# Patient Record
Sex: Male | Born: 1970 | Hispanic: Yes | Marital: Married | Smoking: Never smoker
Health system: Southern US, Community
[De-identification: ages and names within clinical notes are randomized; demographics above are authoritative.]

## PROBLEM LIST (undated history)

## (undated) HISTORY — PX: APPENDECTOMY: SHX54

---

## 2008-04-26 ENCOUNTER — Emergency Department: Payer: Self-pay | Admitting: Emergency Medicine

## 2016-03-31 ENCOUNTER — Ambulatory Visit
Admission: EM | Admit: 2016-03-31 | Discharge: 2016-03-31 | Disposition: A | Discharge status: Home / Self Care | Payer: Self-pay | Attending: Family Medicine | Admitting: Family Medicine

## 2016-03-31 ENCOUNTER — Encounter: Payer: Self-pay | Admitting: Emergency Medicine

## 2016-03-31 DIAGNOSIS — H9192 Unspecified hearing loss, left ear: Secondary | ICD-10-CM

## 2016-03-31 DIAGNOSIS — R42 Dizziness and giddiness: Secondary | ICD-10-CM

## 2016-03-31 LAB — GLUCOSE, CAPILLARY: Glucose-Capillary: 116 mg/dL — ABNORMAL HIGH (ref 65–99)

## 2016-03-31 MED ORDER — MECLIZINE HCL 25 MG PO TABS: ORAL_TABLET | ORAL | Status: AC

## 2016-03-31 NOTE — ED Notes (Signed)
Patient c/o weakness and dizziness that started yesterday.  Patient reports pressure in his left ear for the past 2 months.  Patient denies any pain.

## 2016-03-31 NOTE — ED Provider Notes (Signed)
CSN: [id number]     Arrival date & time 03/31/16  1217 History   First MD Initiated Contact with Patient 03/31/16 1240     Chief Complaint  Patient presents with  . Weakness   (Consider location/radiation/quality/duration/timing/severity/associated sxs/prior Treatment) HPI: Patient presents today with symptoms of dizziness which started yesterday. He states that he feels some pressure in his left ear and has had decreased hearing in his left ear for at least 1-2 months. He denies any ear pain or any URI symptoms. He states that his symptoms typically are with movement of his head. He denies any increased caffeine use and admits to being hydrated. He does work in an [employer] though. He denies any headache or vomiting but has felt nauseous. Denies taking any new medications.  History reviewed. No pertinent past medical history. Past Surgical History  Procedure Laterality Date  . Appendectomy     History reviewed. No pertinent family history. Social History  Substance Use Topics  . Smoking status: Never Smoker   . Smokeless tobacco: None  . Alcohol Use: Yes    Review of Systems: Negative except mentioned above.   Allergies  Review of patient's allergies indicates no known allergies.  Home Medications   Prior to Admission medications   Not on File   Meds Ordered and Administered this Visit  Medications - No data to display  BP 138/94 mmHg  Pulse 76  Temp(Src) 97 F (36.1 C) (Tympanic)  Resp 16  Ht 5\' 7"  (1.702 m)  Wt 164 lb (74.39 kg)  BMI 25.68 kg/m2  SpO2 100% No data found.   Physical Exam:  GENERAL: NAD HEENT: no pharyngeal erythema, no exudate, no erythema of TMs, no cerumen appreciated, no cervical LAD RESP: CTA B CARD: RRR NEURO: CN II-XII grossly intact, -Rombergs  ED Course  Procedures (including critical care time)  Labs Review Labs Reviewed - No data to display  Imaging Review No results found.    MDM  A/P: Left-sided decreased hearing,  dizziness - will treat for vertigo with meclizine, orthostatics were checked, fingerstick blood sugar normal, work excuse given for 2 days, if symptoms worsen go to the ER as discussed. I do recommend that he follow up with ENT given his hearing issues and current symptoms. Encourage patient not to drive until dizziness has resolved. Patient addresses understanding and appreciative    Jolene ProvostKirtida Shealynn Saulnier, MD 03/31/16 1321

## 2019-01-29 ENCOUNTER — Emergency Department: Payer: Self-pay

## 2019-01-29 ENCOUNTER — Encounter: Payer: Self-pay | Admitting: Emergency Medicine

## 2019-01-29 ENCOUNTER — Emergency Department
Admission: EM | Admit: 2019-01-29 | Discharge: 2019-01-29 | Disposition: A | Discharge status: Home / Self Care | Payer: Self-pay | Attending: Emergency Medicine | Admitting: Emergency Medicine

## 2019-01-29 ENCOUNTER — Other Ambulatory Visit: Payer: Self-pay

## 2019-01-29 DIAGNOSIS — G9589 Other specified diseases of spinal cord: Secondary | ICD-10-CM | POA: Insufficient documentation

## 2019-01-29 DIAGNOSIS — M5412 Radiculopathy, cervical region: Secondary | ICD-10-CM | POA: Insufficient documentation

## 2019-01-29 LAB — URINALYSIS, COMPLETE (UACMP) WITH MICROSCOPIC
Bacteria, UA: NONE SEEN
Bilirubin Urine: NEGATIVE
Glucose, UA: NEGATIVE mg/dL
Ketones, ur: NEGATIVE mg/dL
Leukocytes,Ua: NEGATIVE
Nitrite: NEGATIVE
Protein, ur: NEGATIVE mg/dL
Specific Gravity, Urine: 1.025 (ref 1.005–1.030)
Squamous Epithelial / LPF: NONE SEEN (ref 0–5)
pH: 5 (ref 5.0–8.0)

## 2019-01-29 LAB — CBC
HCT: 41.5 % (ref 39.0–52.0)
Hemoglobin: 13.1 g/dL (ref 13.0–17.0)
MCH: 25.7 pg — ABNORMAL LOW (ref 26.0–34.0)
MCHC: 31.6 g/dL (ref 30.0–36.0)
MCV: 81.4 fL (ref 80.0–100.0)
Platelets: 278 10*3/uL (ref 150–400)
RBC: 5.1 MIL/uL (ref 4.22–5.81)
RDW: 14.1 % (ref 11.5–15.5)
WBC: 7 10*3/uL (ref 4.0–10.5)
nRBC: 0 % (ref 0.0–0.2)

## 2019-01-29 LAB — BASIC METABOLIC PANEL
Anion gap: 7 (ref 5–15)
BUN: 20 mg/dL (ref 6–20)
CO2: 26 mmol/L (ref 22–32)
Calcium: 8.3 mg/dL — ABNORMAL LOW (ref 8.9–10.3)
Chloride: 105 mmol/L (ref 98–111)
Creatinine, Ser: 0.71 mg/dL (ref 0.61–1.24)
GFR calc Af Amer: >60 mL/min (ref >60)
GFR calc non Af Amer: >60 mL/min (ref >60)
Glucose, Bld: 106 mg/dL — ABNORMAL HIGH (ref 70–99)
Potassium: 3.8 mmol/L (ref 3.5–5.1)
Sodium: 138 mmol/L (ref 135–145)

## 2019-01-29 LAB — TROPONIN I: Troponin I: <0.03 ng/mL (ref <0.03)

## 2019-01-29 MED ORDER — DEXAMETHASONE SODIUM PHOSPHATE 10 MG/ML IJ SOLN
10.0000 mg | Freq: Once | INTRAMUSCULAR | Status: AC
  Administered 2019-01-29: 10 mg via INTRAVENOUS
  Filled 2019-01-29: qty 1

## 2019-01-29 MED ORDER — GADOBUTROL 1 MMOL/ML IV SOLN
7.0000 mL | Freq: Once | INTRAVENOUS | Status: AC | PRN
  Administered 2019-01-29: 7 mL via INTRAVENOUS

## 2019-01-29 MED ORDER — DEXAMETHASONE 4 MG PO TABS: 4.0000 mg | ORAL_TABLET | Freq: Two times a day (BID) | ORAL | 0 refills | Status: AC

## 2019-01-29 NOTE — ED Provider Notes (Signed)
[facility] Emergency Department Provider Note       Time seen: ----------------------------------------- 8:23 AM on 01/29/2019 -----------------------------------------   I have reviewed the triage vital signs and the nursing notes.  HISTORY   Chief Complaint Chest Pain    HPI Raymond Love is a 48 y.o. male with no significant past medical history who presents to the ED for left-sided chest pain that started this morning.  Patient states he has had weakness in both hands for the last week.  Patient also describes an abnormal sensation that goes down his arms and some weakness with raising his arms overhead.  History reviewed. No pertinent past medical history.  There are no active problems to display for this patient.   Past Surgical History:  Procedure Laterality Date  . APPENDECTOMY      Allergies Patient has no known allergies.  Social History Social History   Tobacco Use  . Smoking status: Never Smoker  . Smokeless tobacco: Never Used  Substance Use Topics  . Alcohol use: Yes  . Drug use: Never   Review of Systems Constitutional: Negative for fever. Cardiovascular: Positive for chest pain Respiratory: Negative for shortness of breath. Gastrointestinal: Negative for abdominal pain, vomiting and diarrhea. Musculoskeletal: Positive for back pain Skin: Negative for rash. Neurological: Positive for arm weakness  All systems negative/normal/unremarkable except as stated in the HPI  ____________________________________________   PHYSICAL EXAM:  VITAL SIGNS: ED Triage Vitals [01/29/19 0642]  Enc Vitals Group     BP      Pulse      Resp      Temp      Temp src      SpO2      Weight 164 lb (74.4 kg)     Height 5\' 3"  (1.6 m)     Head Circumference      Peak Flow      Pain Score 9     Pain Loc      Pain Edu?      Excl. in GC?    Constitutional: Alert and oriented. Well appearing and in no distress. Eyes:  Conjunctivae are normal. Normal extraocular movements. ENT      Head: Normocephalic and atraumatic.      Nose: No congestion/rhinnorhea.      Mouth/Throat: Mucous membranes are moist.      Neck: No stridor. Cardiovascular: Normal rate, regular rhythm. No murmurs, rubs, or gallops. Respiratory: Normal respiratory effort without tachypnea nor retractions. Breath sounds are clear and equal bilaterally. No wheezes/rales/rhonchi. Gastrointestinal: Soft and nontender. Normal bowel sounds Musculoskeletal: Nontender with normal range of motion in extremities. No lower extremity tenderness nor edema. Neurologic:  Normal speech and language. No gross focal neurologic deficits are appreciated.  Skin:  Skin is warm, dry and intact. No rash noted. Psychiatric: Mood and affect are normal. Speech and behavior are normal.  ____________________________________________  EKG: Interpreted by me.  Sinus rhythm with rate of 86 bpm, normal PR interval, normal QRS, normal QT  ____________________________________________  ED COURSE:  As part of my medical decision making, I reviewed the following data within the electronic MEDICAL RECORD NUMBER History obtained from family if available, nursing notes, old chart and ekg, as well as notes from prior ED visits. Patient presented for multiple complaints, we will assess with labs and imaging as indicated at this time.   Procedures  Raymond Love was evaluated in Emergency Department on 01/29/2019 for the symptoms described in the history of present  illness. He was evaluated in the context of the global COVID-19 pandemic, which necessitated consideration that the patient might be at risk for infection with the SARS-CoV-2 virus that causes COVID-19. Institutional protocols and algorithms that pertain to the evaluation of patients at risk for COVID-19 are in a state of rapid change based on information released by regulatory bodies including the CDC and federal and  state organizations. These policies and algorithms were followed during the patient's care in the ED.  ____________________________________________   LABS (pertinent positives/negatives)  Labs Reviewed  BASIC METABOLIC PANEL - Abnormal; Notable for the following components:      Result Value   Glucose, Bld 106 (*)    Calcium 8.3 (*)    All other components within normal limits  CBC - Abnormal; Notable for the following components:   MCH 25.7 (*)    All other components within normal limits  URINALYSIS, COMPLETE (UACMP) WITH MICROSCOPIC - Abnormal; Notable for the following components:   Color, Urine YELLOW (*)    APPearance CLEAR (*)    Hgb urine dipstick SMALL (*)    All other components within normal limits  TROPONIN I   CRITICAL CARE Performed by: Ulice DashJohnathan E Bijon Mineer   Total critical care time: 30 minutes  Critical care time was exclusive of separately billable procedures and treating other patients.  Critical care was necessary to treat or prevent imminent or life-threatening deterioration.  Critical care was time spent personally by me on the following activities: development of treatment plan with patient and/or surrogate as well as nursing, discussions with consultants, evaluation of patient's response to treatment, examination of patient, obtaining history from patient or surrogate, ordering and performing treatments and interventions, ordering and review of laboratory studies, ordering and review of radiographic studies, pulse oximetry and re-evaluation of patient's condition.  RADIOLOGY Images were viewed by me  Chest x-ray, thoracic spine x-ray, cervical spine series IMPRESSION: Large enhancing mass enlarging the right foramen at C3-4. A significant amount of mass is present in the spinal canal causing severe spinal stenosis and severe cord compression. There is edema in the cord. There is probable hemorrhage within the tumor. 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  Cervical spondylosis as above.  These results were called by telephone at the time of interpretation on 01/29/2019 at 12:20 pm to Dr. Daryel NovemberJONATHAN Ivy Puryear , who verbally acknowledged these results.  ____________________________________________   DIFFERENTIAL DIAGNOSIS   Degenerative disc disease, herniated disc, cervical radiculopathy, arthritis, musculoskeletal pain, unstable angina  FINAL ASSESSMENT AND PLAN  Cervical spine mass, radiculopathy   Plan: The patient had presented for multiple complaints. Patient's labs are reassuring. Patient's imaging unfortunately revealed a large enhancing mass at C3-C4 with severe spinal stenosis and cord compression.  Amazingly he has some arm weakness, worse on the right with paresthesias.  He also notes decrease pain and temperature sensation in his left arm.  I have discussed with neurosurgery for evaluation.  He has received IV Decadron in the ER.   Ulice DashJohnathan E Harli Engelken, MD    Note: This note was generated in part or whole with voice recognition software. Voice recognition is usually quite accurate but there are transcription errors that can and very often do occur. I apologize for any typographical errors that were not detected and corrected.     Emily FilbertWilliams, Myalynn Lingle E, MD 01/29/19 [phone]

## 2019-01-29 NOTE — ED Provider Notes (Signed)
Patient has been evaluated by neurosurgery, plan is for follow-up with Dr. Marcell Barlow tomorrow.  He will continue on Decadron 4 mg twice a day.  Patient is in agreement with this plan.   Emily Filbert, MD 01/29/19 [phone]

## 2019-01-29 NOTE — ED Triage Notes (Signed)
Patient with complaint of left side chest pain that started this morning. Patient also states that he has had weakness in bilateral hands times one week.

## 2019-01-29 NOTE — Consult Note (Signed)
Referring Physician:  No referring provider defined for this encounter.  Primary Physician:  [facility]  Chief Complaint: Upper extremity weakness  History of Present Illness: Raymond Love is a 48 y.o. male who presents as a consult from the emergency department for spinal cord mass resulting in severe spinal cord compression.  Patient states that he has been experiencing upper and lower extremity sensory changes for quite some time but over the last 2 weeks he has been experiencing progressive upper and lower extremity weakness.  Symptoms consistent with cervical myelopathy including brain lower extremity weakness and numbness, balance issues, issues holding onto objects.  Weakness is more prominent throughout the left side.  He has been falling more frequently and will at times use a walker to help with ambulation since the weakness started.  The symptoms are causing a significant impact on the patient's life.   Review of Systems:  A 10 point review of systems is negative, except for the pertinent positives and negatives detailed in the HPI.  Past Medical History: History reviewed. No pertinent past medical history.  Past Surgical History: Past Surgical History:  Procedure Laterality Date  . APPENDECTOMY      Allergies: Allergies as of 01/29/2019  . (No Known Allergies)    Medications: No current facility-administered medications for this encounter.   Current Outpatient Medications:  .  meclizine (ANTIVERT) 25 MG tablet, Take one tab PO tid prn vertigo., Disp: 15 tablet, Rfl: 0   Social History: Social History   Tobacco Use  . Smoking status: Never Smoker  . Smokeless tobacco: Never Used  Substance Use Topics  . Alcohol use: Yes  . Drug use: Never    Family Medical History: No family history on file.  Physical Examination: Vitals:   01/29/19 0900 01/29/19 0930  BP: (!) 133/94 (!) 126/94  Pulse: 86 81  Resp: 17 19  SpO2: 100%  98%     General: Patient is well developed, well nourished, calm, collected, and in no apparent distress.  Psychiatric: Patient is non-anxious.  Head:  Pupils equal, round, and reactive to light.  ENT:  Oral mucosa appears well hydrated.  Neck:   Supple.  Full range of motion.  Respiratory: Patient is breathing without any difficulty.  Extremities: No edema.  Vascular: Palpable pulses in dorsal pedal vessels.  Skin:   On exposed skin, there are no abnormal skin lesions.  NEUROLOGICAL:  General: In no acute distress.   Awake, alert, oriented to person, place, and time.  Pupils equal round and reactive to light.  Facial tone is symmetric.  Tongue protrusion is midline.  There is no pronator drift. Palpation of spine: nontender.    Strength: Side Biceps Triceps Deltoid Interossei Grip Wrist Ext. Wrist Flex.  R 4 5 5  4- 4 5 5   L 4 5 5  4- 4 5 5    Side Iliopsoas Quads Hamstring PF DF EHL  R 5 5 5 5 5 5   L 5 5 5 5 5 5    Reflexes are 3+ and symmetric at the biceps, triceps, brachioradialis, patella and achilles.   Decreased sensation throughout left upper extremity and right lower extremity.  Clonus is not present.  Toes are down-going.  Gait is unsteady.  Significant difficulty with tandem gait.  Hoffman's is present on the right.  Imaging:  EXAM: MRI CERVICAL SPINE WITHOUT AND WITH CONTRAST  TECHNIQUE: Multiplanar and multiecho pulse sequences of the cervical spine, to include the craniocervical junction and cervicothoracic junction, were  obtained without and with intravenous contrast.  CONTRAST:  7 mL Gadovist IV  COMPARISON:  CT cervical spine 01/29/2019  FINDINGS: Alignment: Normal  Vertebrae: Subcentimeter sclerotic lesion in the C7 vertebral body on the right appears benign. No enhancement or edema. Probable bone island. No vertebral fracture.  Cord: Large extramedullary mass on the right at C3-4 extending into the foramen. This extends into the spinal  canal and is causing severe spinal stenosis and cord compression. Cord is markedly displaced to the left and shows diffuse edema.  Posterior Fossa, vertebral arteries, paraspinal tissues: Large mass in the right foramen at C3-4 otherwise negative  Disc levels:  C2-3: Negative  C3-4: Large enhancing mass lesion in the right foramen extending into the spinal canal. The mass shows moderate enhancement which is heterogeneous. On axial images the mass measures approximately 23 x 11 mm. There is some susceptibility within the mass which may represent hemorrhage. There is severe spinal stenosis and severe cord compression. Cord is displaced to the left. The right foramen is enlarged.  C4-5: Mild disc degeneration  C5-6: Moderate disc degeneration with diffuse uncinate spurring. Mild foraminal stenosis bilaterally  C6-7: Mild disc degeneration and spurring without significant stenosis  C7-T1: Negative  IMPRESSION: Large enhancing mass enlarging the right foramen at C3-4. A significant amount of mass is present in the spinal canal causing severe spinal stenosis and severe cord compression. There is edema in the cord. There is probable hemorrhage within the tumor. 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  Cervical spondylosis as above.   Assessment and Plan: Raymond Love is a pleasant 48 y.o. 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  Dr. Adriana Simasook has reviewed imaging and discussed physical exam findings with him.  Recommendations include continuing Decadron 4 mg twice daily until he is able to follow-up in clinic.  Dr. Adriana Simasook plans to discuss his case with Dr. Myer HaffYarbrough see if it is possible for patient to [facility] and begin  surgical planning.  I discussed this with patient and he expressed agreement and understanding plan. Advised in the meantime to also continue using walker while ambulating to prevent further injury.    Ivar DrapeAmanda Kania Regnier, PA-C Dept. of Neurosurgery

## 2019-01-29 NOTE — ED Notes (Signed)
Pt discharged home after verbalizing understanding of discharge instructions; nad noted. 

## 2020-03-23 IMAGING — CR THORACIC SPINE 2 VIEWS
3 series · 3 of 3 positions shown · non-contrast
Comparison: None.

CLINICAL DATA: Back pain for several hours

EXAM:
THORACIC SPINE 2 VIEWS

[t-spine ap]
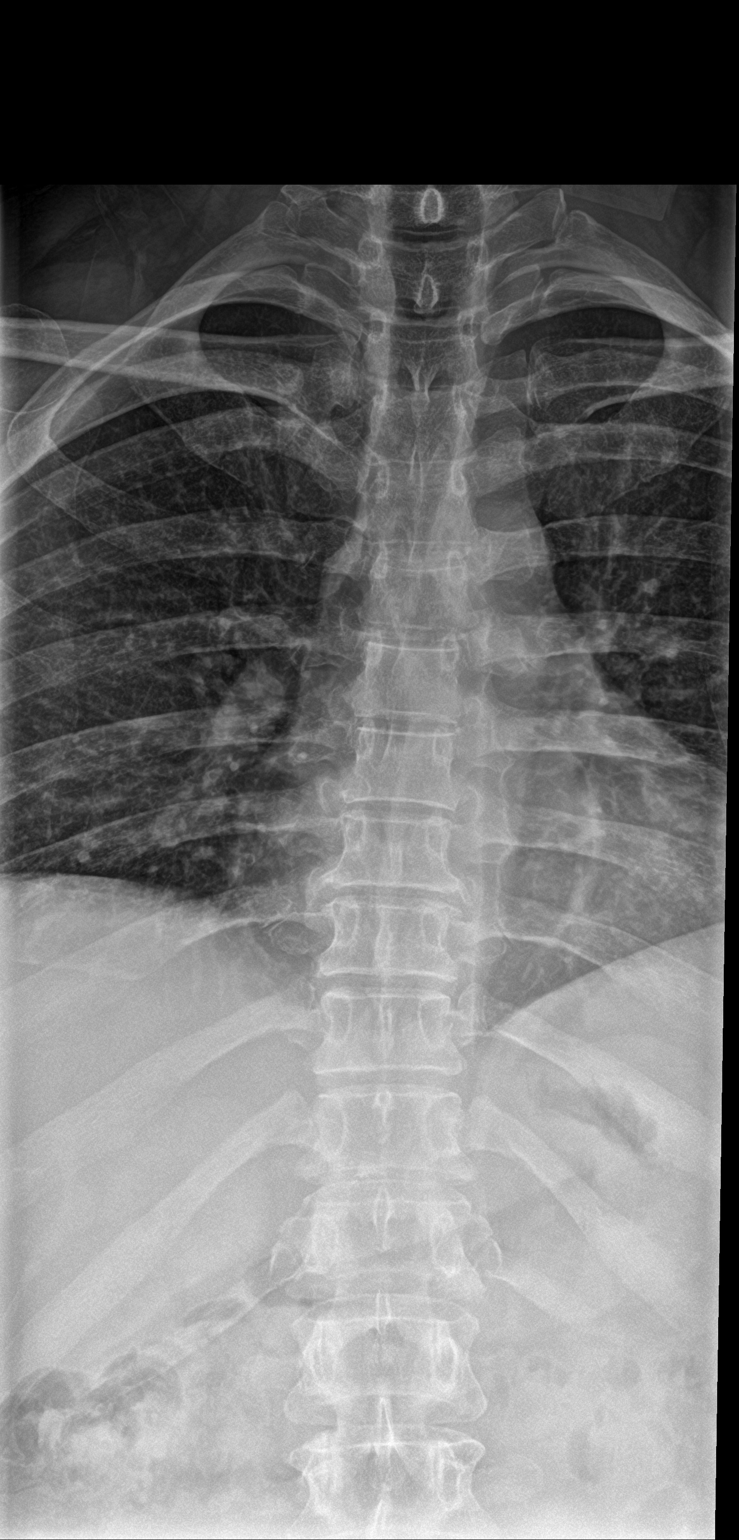

[t-spine lat]
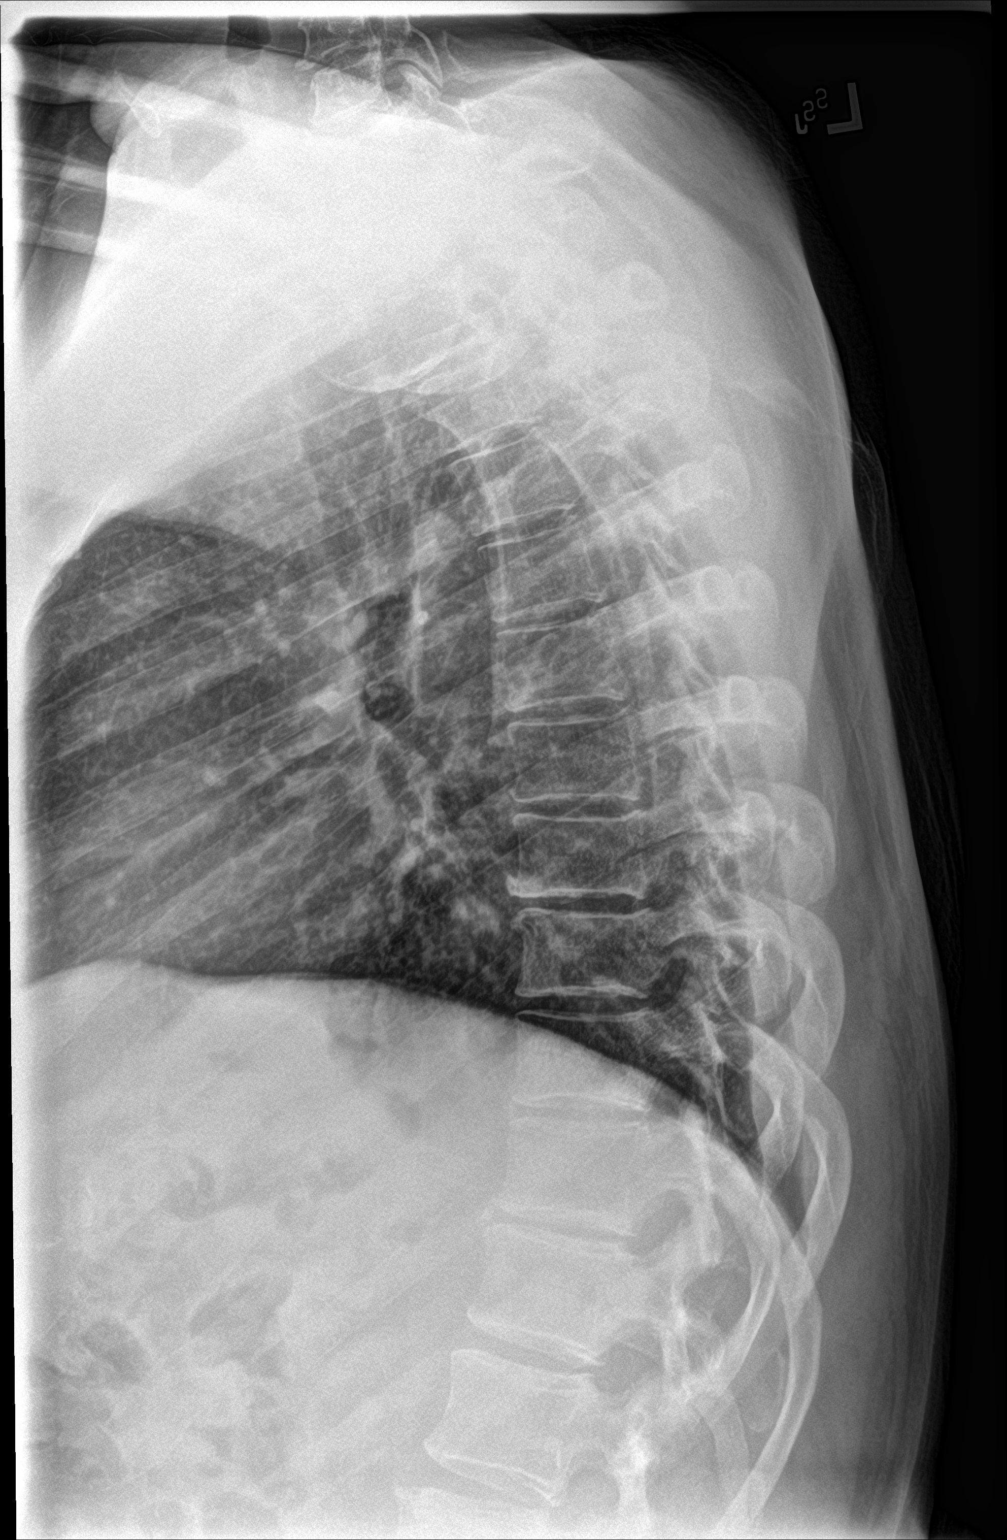

[t-spine swimmers]
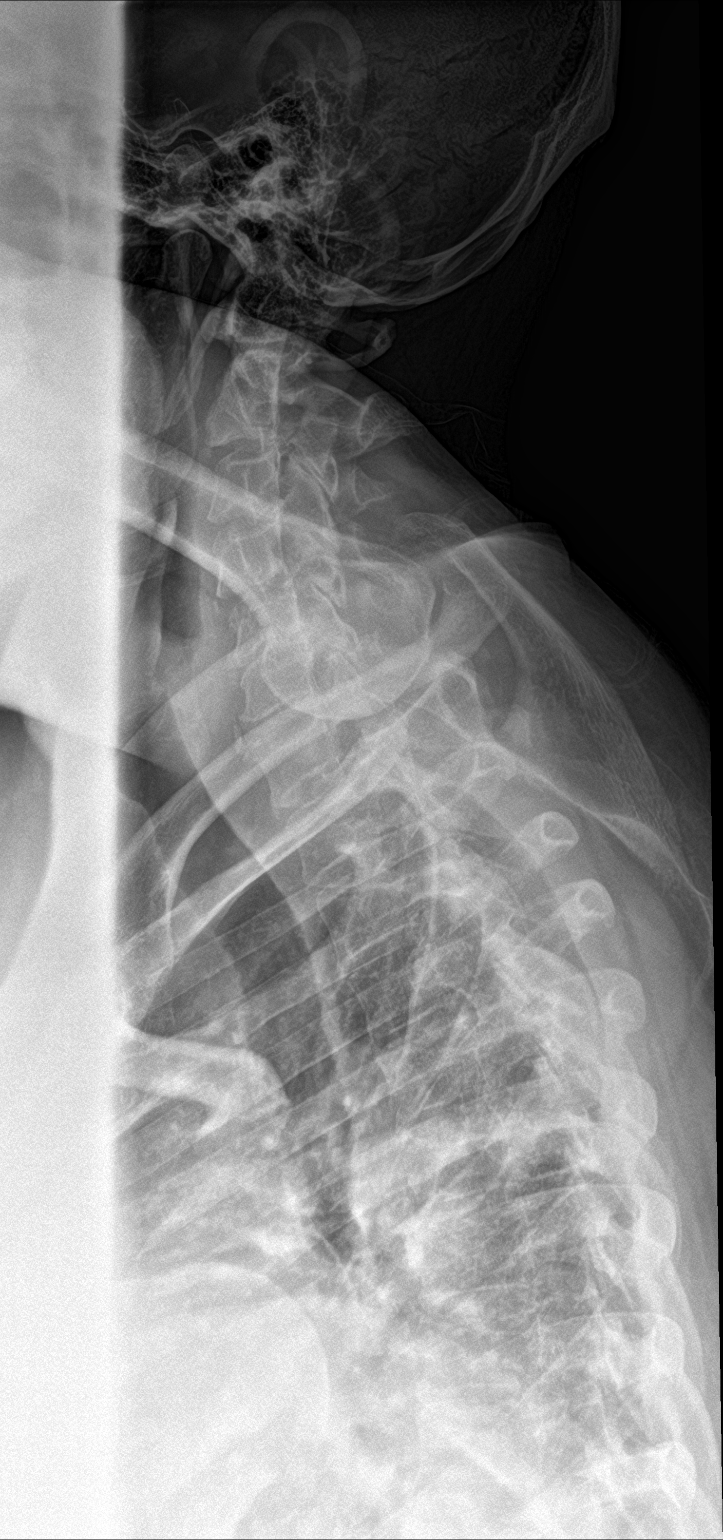

[3 of 3 positions shown; findings below may reference images not displayed]

FINDINGS: Vertebral body height is well maintained. Mild osteophytic changes
are seen. Pedicles are within normal limits. No paraspinal mass is
seen.
IMPRESSION: Mild degenerative change without acute abnormality.

## 2022-02-18 ENCOUNTER — Other Ambulatory Visit: Payer: Self-pay | Admitting: Neurosurgery

## 2022-02-18 DIAGNOSIS — Z981 Arthrodesis status: Secondary | ICD-10-CM

## 2022-02-25 ENCOUNTER — Other Ambulatory Visit: Payer: Self-pay
